# Patient Record
Sex: Male | Born: 1970
Health system: Southern US, Community
[De-identification: ages and names within clinical notes are randomized; demographics above are authoritative.]

## PROBLEM LIST (undated history)

## (undated) DIAGNOSIS — T7840XA Allergy, unspecified, initial encounter: Secondary | ICD-10-CM

## (undated) DIAGNOSIS — K219 Gastro-esophageal reflux disease without esophagitis: Secondary | ICD-10-CM

## (undated) DIAGNOSIS — Z789 Other specified health status: Secondary | ICD-10-CM

## (undated) HISTORY — PX: NASAL SINUS SURGERY: SHX719

## (undated) HISTORY — DX: Allergy, unspecified, initial encounter: T78.40XA

## (undated) HISTORY — DX: Gastro-esophageal reflux disease without esophagitis: K21.9

---

## 2001-11-13 ENCOUNTER — Encounter: Payer: Self-pay | Admitting: Surgery

## 2001-11-13 ENCOUNTER — Encounter: Admission: RE | Admit: 2001-11-13 | Discharge: 2001-11-13 | Payer: Self-pay | Admitting: Surgery

## 2007-02-03 ENCOUNTER — Ambulatory Visit: Payer: Self-pay | Admitting: Emergency Medicine

## 2011-07-09 ENCOUNTER — Ambulatory Visit: Payer: Self-pay | Admitting: Unknown Physician Specialty

## 2012-10-16 ENCOUNTER — Ambulatory Visit (INDEPENDENT_AMBULATORY_CARE_PROVIDER_SITE_OTHER): Payer: 59 | Admitting: Sports Medicine

## 2012-10-16 VITALS — BP 143/89 | Ht 75.0 in | Wt 240.0 lb

## 2012-10-16 DIAGNOSIS — M25572 Pain in left ankle and joints of left foot: Secondary | ICD-10-CM

## 2012-10-16 DIAGNOSIS — M216X9 Other acquired deformities of unspecified foot: Secondary | ICD-10-CM

## 2012-10-16 DIAGNOSIS — M25579 Pain in unspecified ankle and joints of unspecified foot: Secondary | ICD-10-CM | POA: Insufficient documentation

## 2012-10-17 NOTE — Progress Notes (Signed)
  Subjective:    Patient ID: Edward Spears, male    DOB: June 05, 1970, 42 y.o.   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 He's been treated intermittently with chiropractic care over the years and this has been helpful. It was suggested to him that he may want to try some orthotics which is the main reason for today's visit. He's also experiencing some lateral left ankle pain. He suffered a rather significant injury to this ankle while skiing back in February. He twisted the ankle rather violently in his ski boot. Had significant swelling shortly thereafter. His wife works at a urologist office and she was able to get x-rays of his ankle. They are available for my review. Overall, his ankle pain has improved since his injury. Main complaint is stiffness first thing in the morning that improves with walking. Also some discomfort with dorsi flexion. He has not noticed any return swelling. No mechanical symptoms. Denies significant injury to this ankle in the past. No prior ankle surgery.  He is otherwise healthy. Takes no chronic medications. No known drug allergies.     Review of Systems     Objective:   Physical Exam Well-developed, well-nourished. No acute distress. Awake alert and oriented x3.  Left ankle: Full range of motion. No effusion. No soft tissue swelling. Negative anterior drawer but a 1-2+ talar tilt. There is tenderness to palpation over the ATF and over the anterior lateral joint line. No tenderness to palpation over the distal fibula or medial malleolus. No tenderness at the base of the fifth metatarsal nor at the navicular. No tenderness to palpation along the peroneal tendons. No peroneal tendon subluxation with active foot eversion. Neurovascularly intact distally. Good dorsalis pedis and posterior tibial pulses.  Examination of  his feet show fairly well-preserved longitudinal arches but some collapse of the transverse arch is bilaterally. There is no significant callus buildup of the metatarsal heads. No appreciable leg length discrepancy. Patient walks with a supinated gait but no limp.       Assessment & Plan:  1. Supination 2. Status post left ankle injury  Green sports insoles with lateral posts for his supination. Home exercise program for ankle rehabilitation including range of motion and strengthening exercises. Over-the-counter compression sleeve to be worn as needed. Followup with me in 4 weeks. At some point we may need to consider custom orthotics for this gentleman. If his ankle pain persists, we may need to consider further diagnostic imaging.

## 2012-11-29 ENCOUNTER — Ambulatory Visit (INDEPENDENT_AMBULATORY_CARE_PROVIDER_SITE_OTHER): Payer: 59 | Admitting: Sports Medicine

## 2012-11-29 VITALS — BP 135/83 | Ht 75.0 in | Wt 240.0 lb

## 2012-11-29 DIAGNOSIS — M216X9 Other acquired deformities of unspecified foot: Secondary | ICD-10-CM

## 2012-11-29 DIAGNOSIS — M25512 Pain in left shoulder: Secondary | ICD-10-CM

## 2012-11-29 DIAGNOSIS — M25519 Pain in unspecified shoulder: Secondary | ICD-10-CM

## 2012-11-29 NOTE — Progress Notes (Signed)
  Subjective:    Patient ID: Edward Spears, male    DOB: 1971-01-28, 42 y.o.   MRN: [id number]  HPI Patient comes in today for followup. He would like to go ahead with some custom orthotics. He is doing well with his green sports insoles. Ankle is feeling better as well. Is complaining today of 2 weeks of lateral/posterior left shoulder pain. He suffered a dislocation to the shoulder some tender 12 years ago. Workup per his report including x-rays and MRI were unremarkable. He will get occasional discomfort along the lateral shoulder. Awakens him at night from time to time. He has not really taken much in the way of over-the-counter pain medication for this. No recent trauma. He is right-handed.  Interim medical history is unchanged   Review of Systems     Objective:   Physical Exam Well-developed, well-nourished. No acute distress. Awake alert and oriented x3  Left shoulder: Full active range of motion. He does have some limitation in passive external rotation with the left shoulder compared to the right but a negative apprehension. No tenderness along the clavicle or over the a.c. Joint. Rotator cuff strength is 5/5. Negative empty can, negative Hawkins. Negative O'Brien's. Negative clunk. No tenderness over the bicipital groove. No atrophy. Neurovascularly intact distally.  Examination of his feet show a fairly well-preserved longitudinal arch with some collapse of the transverse arch. He walks in slight supination.       Assessment & Plan:  1. Mild left shoulder pain status post remote shoulder dislocation 2. Supination  We will go ahead and make him a pair of custom orthotics today. We will add a small lateral heel wedge to help with supination. For his left shoulder I will show him some general shoulder strengthening exercises. Followup in 4 weeks for a recheck on both his orthotics and his left shoulder. Over-the-counter anti-inflammatories as needed for pain. Call with questions  or concerns prior to follow visit.  Patient was fitted for a : standard, cushioned, semi-rigid orthotic. The orthotic was heated and afterward the patient stood on the orthotic blank positioned on the orthotic stand. The patient was positioned in subtalar neutral position and 10 degrees of ankle dorsiflexion in a weight bearing stance. After completion of molding, a stable base was applied to the orthotic blank. The blank was ground to a stable position for weight bearing. Size:13 Base: Blue EVB Posting: none Additional orthotic padding: lateral heel wedges bilaterally

## 2013-01-01 ENCOUNTER — Ambulatory Visit: Payer: 59 | Admitting: Sports Medicine

## 2013-10-08 ENCOUNTER — Ambulatory Visit: Payer: Self-pay

## 2014-06-12 ENCOUNTER — Ambulatory Visit: Payer: Self-pay | Admitting: Physician Assistant

## 2014-06-12 LAB — RAPID STREP-A WITH REFLX: Micro Text Report: NEGATIVE

## 2014-06-15 LAB — BETA STREP CULTURE(ARMC)

## 2016-03-21 ENCOUNTER — Ambulatory Visit
Admission: EM | Admit: 2016-03-21 | Discharge: 2016-03-21 | Disposition: A | Discharge status: Home / Self Care | Payer: 59 | Attending: Family Medicine | Admitting: Family Medicine

## 2016-03-21 ENCOUNTER — Encounter: Payer: Self-pay | Admitting: Emergency Medicine

## 2016-03-21 DIAGNOSIS — H6501 Acute serous otitis media, right ear: Secondary | ICD-10-CM

## 2016-03-21 MED ORDER — AMOXICILLIN 875 MG PO TABS: 875.0000 mg | ORAL_TABLET | Freq: Two times a day (BID) | ORAL | 0 refills | Status: DC

## 2016-03-21 NOTE — ED Triage Notes (Signed)
Patient c/o pain and pressure in his right ear for over a week.

## 2016-03-21 NOTE — ED Provider Notes (Signed)
[facility]    CSN: [id number] Arrival date & time: 03/21/16  1311     History   Chief Complaint Chief Complaint  Patient presents with  . Ear Fullness  . Otalgia    right    HPI Edward Spears is a 45 y.o. male.   45 yo male with a c/o right ear pain for one week and 2 days of mild left ear pressure as well, associated with mild congestion. Denies any fevers, chills, sore throat, cough.    The history is provided by the patient.    History reviewed. No pertinent past medical history.  Patient Active Problem List   Diagnosis Date Noted  . Pain in joint, ankle and foot 10/16/2012    History reviewed. No pertinent surgical history.     Home Medications    Prior to Admission medications   Medication Sig Start Date End Date Taking? Authorizing Provider  amoxicillin (AMOXIL) 875 MG tablet Take 1 tablet (875 mg total) by mouth 2 (two) times daily. 03/21/16   Norval Gable, MD    Family History History reviewed. No pertinent family history.  Social History Social History  Substance Use Topics  . Smoking status: Never Smoker  . Smokeless tobacco: Never Used  . Alcohol use Yes     Allergies   Patient has no known allergies.   Review of Systems Review of Systems   Physical Exam Triage Vital Signs ED Triage Vitals  Enc Vitals Group     BP 03/21/16 1339 137/81     Pulse Rate 03/21/16 1339 68     Resp 03/21/16 1339 16     Temp 03/21/16 1339 97.5 F (36.4 C)     Temp Source 03/21/16 1339 Tympanic     SpO2 03/21/16 1339 100 %     Weight 03/21/16 1338 240 lb (108.9 kg)     Height 03/21/16 1338 6\' 3"  (1.905 m)     Head Circumference --      Peak Flow --      Pain Score 03/21/16 1338 4     Pain Loc --      Pain Edu? --      Excl. in [place]? --    No data found.   Updated Vital Signs BP 137/81 (BP Location: Left Arm)   Pulse 68   Temp 97.5 F (36.4 C) (Tympanic)   Resp 16   Ht 6\' 3"  (1.905 m)   Wt 240 lb (108.9 kg)   SpO2 100%    BMI 30.00 kg/m   Visual Acuity Right Eye Distance:   Left Eye Distance:   Bilateral Distance:    Right Eye Near:   Left Eye Near:    Bilateral Near:     Physical Exam  Constitutional: He appears well-developed and well-nourished. No distress.  HENT:  Head: Normocephalic and atraumatic.  Right Ear: External ear and ear canal normal. Tympanic membrane is erythematous and bulging. A middle ear effusion is present.  Left Ear: Tympanic membrane, external ear and ear canal normal.  Nose: Nose normal.  Mouth/Throat: Uvula is midline, oropharynx is clear and moist and mucous membranes are normal. No oropharyngeal exudate or tonsillar abscesses.  Eyes: Conjunctivae and EOM are normal. Pupils are equal, round, and reactive to light. Right eye exhibits no discharge. Left eye exhibits no discharge. No scleral icterus.  Neck: Normal range of motion. Neck supple. No tracheal deviation present. No thyromegaly present.  Pulmonary/Chest: Effort normal. No stridor. No respiratory distress.  Lymphadenopathy:    He has no cervical adenopathy.  Neurological: He is alert.  Skin: Skin is warm and dry. No rash noted. He is not diaphoretic.  Nursing note and vitals reviewed.    UC Treatments / Results  Labs (all labs ordered are listed, but only abnormal results are displayed) Labs Reviewed - No data to display  EKG  EKG Interpretation None       Radiology No results found.  Procedures Procedures (including critical care time)  Medications Ordered in UC Medications - No data to display   Initial Impression / Assessment and Plan / UC Course  I have reviewed the triage vital signs and the nursing notes.  Pertinent labs & imaging results that were available during my care of the patient were reviewed by me and considered in my medical decision making (see chart for details).  Clinical Course       Final Clinical Impressions(s) / UC Diagnoses   Final diagnoses:  Right acute  serous otitis media, recurrence not specified    New Prescriptions New Prescriptions   AMOXICILLIN (AMOXIL) 875 MG TABLET    Take 1 tablet (875 mg total) by mouth 2 (two) times daily.   1.diagnosis reviewed with patient 2. rx as per orders above; reviewed possible side effects, interactions, risks and benefits  3. Recommend supportive treatment with otc analgesics 4. Follow-up prn if symptoms worsen or don't improve   Norval Gable, MD 03/21/16 1436

## 2016-03-24 ENCOUNTER — Telehealth: Payer: Self-pay | Admitting: *Deleted

## 2016-05-27 DIAGNOSIS — J209 Acute bronchitis, unspecified: Secondary | ICD-10-CM | POA: Diagnosis not present

## 2016-05-31 ENCOUNTER — Emergency Department (HOSPITAL_COMMUNITY): Payer: 59

## 2016-05-31 ENCOUNTER — Emergency Department (HOSPITAL_COMMUNITY)
Admission: EM | Admit: 2016-05-31 | Discharge: 2016-05-31 | Disposition: A | Discharge status: Home / Self Care | Payer: 59 | Attending: Emergency Medicine | Admitting: Emergency Medicine

## 2016-05-31 ENCOUNTER — Encounter (HOSPITAL_COMMUNITY): Payer: Self-pay | Admitting: Emergency Medicine

## 2016-05-31 DIAGNOSIS — Z79899 Other long term (current) drug therapy: Secondary | ICD-10-CM | POA: Diagnosis not present

## 2016-05-31 DIAGNOSIS — R112 Nausea with vomiting, unspecified: Secondary | ICD-10-CM | POA: Diagnosis not present

## 2016-05-31 DIAGNOSIS — R079 Chest pain, unspecified: Secondary | ICD-10-CM | POA: Diagnosis not present

## 2016-05-31 DIAGNOSIS — K209 Esophagitis, unspecified without bleeding: Secondary | ICD-10-CM

## 2016-05-31 DIAGNOSIS — R05 Cough: Secondary | ICD-10-CM | POA: Diagnosis not present

## 2016-05-31 LAB — CBC
HEMATOCRIT: 50.8 % (ref 39.0–52.0)
Hemoglobin: 18.1 g/dL — ABNORMAL HIGH (ref 13.0–17.0)
MCH: 32.3 pg (ref 26.0–34.0)
MCHC: 35.6 g/dL (ref 30.0–36.0)
MCV: 90.6 fL (ref 78.0–100.0)
Platelets: 121 10*3/uL — ABNORMAL LOW (ref 150–400)
RBC: 5.61 MIL/uL (ref 4.22–5.81)
RDW: 12.5 % (ref 11.5–15.5)
WBC: 11.4 10*3/uL — ABNORMAL HIGH (ref 4.0–10.5)

## 2016-05-31 LAB — BASIC METABOLIC PANEL
Anion gap: 10 (ref 5–15)
BUN: 11 mg/dL (ref 6–20)
CHLORIDE: 98 mmol/L — AB (ref 101–111)
CO2: 26 mmol/L (ref 22–32)
Calcium: 8.9 mg/dL (ref 8.9–10.3)
Creatinine, Ser: 0.85 mg/dL (ref 0.61–1.24)
GFR calc Af Amer: >60 mL/min (ref >60)
GFR calc non Af Amer: >60 mL/min (ref >60)
GLUCOSE: 99 mg/dL (ref 65–99)
Potassium: 3.8 mmol/L (ref 3.5–5.1)
Sodium: 134 mmol/L — ABNORMAL LOW (ref 135–145)

## 2016-05-31 LAB — HEPATIC FUNCTION PANEL
ALK PHOS: 96 U/L (ref 38–126)
ALT: 41 U/L (ref 17–63)
AST: 40 U/L (ref 15–41)
Albumin: 3.5 g/dL (ref 3.5–5.0)
BILIRUBIN DIRECT: 0.1 mg/dL (ref 0.1–0.5)
BILIRUBIN INDIRECT: 0.8 mg/dL (ref 0.3–0.9)
BILIRUBIN TOTAL: 0.9 mg/dL (ref 0.3–1.2)
Total Protein: 6.7 g/dL (ref 6.5–8.1)

## 2016-05-31 LAB — I-STAT TROPONIN, ED: Troponin i, poc: 0 ng/mL (ref 0.00–0.08)

## 2016-05-31 MED ORDER — PANTOPRAZOLE SODIUM 20 MG PO TBEC: 20.0000 mg | DELAYED_RELEASE_TABLET | Freq: Every day | ORAL | 0 refills | Status: DC

## 2016-05-31 MED ORDER — SODIUM CHLORIDE 0.9 % IV BOLUS (SEPSIS)
1000.0000 mL | Freq: Once | INTRAVENOUS | Status: AC
  Administered 2016-05-31: 1000 mL via INTRAVENOUS

## 2016-05-31 MED ORDER — PANTOPRAZOLE SODIUM 40 MG IV SOLR
40.0000 mg | Freq: Once | INTRAVENOUS | Status: AC
  Administered 2016-05-31: 40 mg via INTRAVENOUS
  Filled 2016-05-31: qty 40

## 2016-05-31 NOTE — Discharge Instructions (Signed)
Stop taking the steroids and the antibiotic, follow-up with your  primary care doctor, take antacids as prescribed

## 2016-05-31 NOTE — ED Notes (Signed)
Patient is A & O x4.  He understood discharge instructions 

## 2016-05-31 NOTE — ED Provider Notes (Signed)
[facility] DEPT Provider Note   CSN: [id number] Arrival date & time: 05/31/16  0737     History   Chief Complaint Chief Complaint  Patient presents with  . Chest Pain    HPI Edward Spears is a 46 y.o. male. HPI Pt is complaining of severe heartburn.  It started last Sunday.  He first started with an episode of vomiting after what he though was a bought of food poisoning.  Vomiting settled down on Tuesday.   He had persistent throat irritation.   He went to an ENT and was started on abx and steroid a couple days later.  He then started having diarrhea and urinary urgency.  Over the weekend he developed burning in his throat and in the center of his chest.  It feels tight and it hurts.   It hurts to swallow and his mouth is dry. This morning he had diarrhea again and had dry heaves.     History reviewed. No pertinent past medical history.  Patient Active Problem List   Diagnosis Date Noted  . Pain in joint, ankle and foot 10/16/2012    History reviewed. No pertinent surgical history.     Home Medications    Prior to Admission medications   Medication Sig Start Date End Date Taking? Authorizing Provider  amoxicillin (AMOXIL) 875 MG tablet Take 1 tablet (875 mg total) by mouth 2 (two) times daily. 03/21/16   Norval Gable, MD  pantoprazole (PROTONIX) 20 MG tablet Take 1 tablet (20 mg total) by mouth daily. 05/31/16   Dorie Rank, MD    Family History No family history on file.  Social History Social History  Substance Use Topics  . Smoking status: Never Smoker  . Smokeless tobacco: Never Used  . Alcohol use Yes     Allergies   Patient has no known allergies.   Review of Systems Review of Systems  Constitutional: Negative for fever.  Gastrointestinal: Positive for nausea. Negative for abdominal pain.  All other systems reviewed and are negative.    Physical Exam Updated Vital Signs BP 137/100 (BP Location: Left Arm)   Pulse 77   Temp 98.3 F (36.8 C)  (Oral)   Resp 18   SpO2 100%   Physical Exam  Constitutional: He appears well-developed and well-nourished. No distress.  HENT:  Head: Normocephalic and atraumatic.  Right Ear: External ear normal.  Left Ear: External ear normal.  Eyes: Conjunctivae are normal. Right eye exhibits no discharge. Left eye exhibits no discharge. No scleral icterus.  Neck: Neck supple. No tracheal deviation present.  Cardiovascular: Normal rate, regular rhythm and intact distal pulses.   Pulmonary/Chest: Effort normal and breath sounds normal. No stridor. No respiratory distress. He has no wheezes. He has no rales.  Abdominal: Soft. Bowel sounds are normal. He exhibits no distension. There is no tenderness. There is no rebound and no guarding.  Musculoskeletal: He exhibits no edema or tenderness.  Neurological: He is alert. He has normal strength. No cranial nerve deficit (no facial droop, extraocular movements intact, no slurred speech) or sensory deficit. He exhibits normal muscle tone. He displays no seizure activity. Coordination normal.  Skin: Skin is warm and dry. No rash noted.  Psychiatric: He has a normal mood and affect.  Nursing note and vitals reviewed.    ED Treatments / Results  Labs (all labs ordered are listed, but only abnormal results are displayed) Labs Reviewed  BASIC METABOLIC PANEL - Abnormal; Notable for the following:  Result Value   Sodium 134 (*)    Chloride 98 (*)    All other components within normal limits  CBC - Abnormal; Notable for the following:    WBC 11.4 (*)    Hemoglobin 18.1 (*)    Platelets 121 (*)    All other components within normal limits  HEPATIC FUNCTION PANEL  I-STAT TROPOININ, ED    EKG  EKG Interpretation  Date/Time:  Monday May 31 2016 07:56:43 EST Ventricular Rate:  79 PR Interval:    QRS Duration: 91 QT Interval:  362 QTC Calculation: 415 R Axis:   38 Text Interpretation:  Sinus rhythm Baseline wander in lead(s) V4 V6 No old  tracing to compare Confirmed by Niara Bunker  MD-J, Eugena Rhue [phone]) on 05/31/2016 9:09:33 AM       Radiology Dg Chest 2 View  Result Date: 05/31/2016 CLINICAL DATA:  Chest pain.  Cough. EXAM: CHEST  2 VIEW COMPARISON:  None. FINDINGS: The heart size and mediastinal contours are within normal limits. Slight peribronchial thickening. Slight thoracolumbar scoliosis. IMPRESSION: Slight bronchitic changes. Electronically Signed   By: Lorriane Shire M.D.   On: 05/31/2016 08:40    Procedures Procedures (including critical care time)  Medications Ordered in ED Medications  pantoprazole (PROTONIX) injection 40 mg (40 mg Intravenous Given 05/31/16 1014)  sodium chloride 0.9 % bolus 1,000 mL (1,000 mLs Intravenous New Bag/Given 05/31/16 1014)     Initial Impression / Assessment and Plan / ED Course  I have reviewed the triage vital signs and the nursing notes.  Pertinent labs & imaging results that were available during my care of the patient were reviewed by me and considered in my medical decision making (see chart for details).  Clinical Course   Patient's laboratory tests are reassuring. I doubt that his symptoms are related to a pulmonary or cardiac etiology. I suspect he is having esophagitis associated with his recent illness and possibly a side effect of the medications he has been taking. I will have him discontinue the steroids and the antibiotic.  DC home with antacids.  Final Clinical Impressions(s) / ED Diagnoses   Final diagnoses:  Esophagitis    New Prescriptions New Prescriptions   PANTOPRAZOLE (PROTONIX) 20 MG TABLET    Take 1 tablet (20 mg total) by mouth daily.     Dorie Rank, MD 05/31/16 [phone]

## 2016-05-31 NOTE — ED Triage Notes (Signed)
Pt complaint of continued "heart burn" since Thursday. Pt continues to report recently treated for URI.

## 2016-06-16 DIAGNOSIS — K2100 Gastro-esophageal reflux disease with esophagitis, without bleeding: Secondary | ICD-10-CM | POA: Insufficient documentation

## 2016-06-16 DIAGNOSIS — Z0000 Encounter for general adult medical examination without abnormal findings: Secondary | ICD-10-CM | POA: Diagnosis not present

## 2016-06-23 DIAGNOSIS — J209 Acute bronchitis, unspecified: Secondary | ICD-10-CM | POA: Diagnosis not present

## 2016-06-23 DIAGNOSIS — R05 Cough: Secondary | ICD-10-CM | POA: Diagnosis not present

## 2016-07-06 DIAGNOSIS — K219 Gastro-esophageal reflux disease without esophagitis: Secondary | ICD-10-CM | POA: Diagnosis not present

## 2017-01-12 DIAGNOSIS — L570 Actinic keratosis: Secondary | ICD-10-CM | POA: Diagnosis not present

## 2017-01-12 DIAGNOSIS — D225 Melanocytic nevi of trunk: Secondary | ICD-10-CM | POA: Diagnosis not present

## 2017-06-10 ENCOUNTER — Other Ambulatory Visit: Payer: Self-pay

## 2017-06-10 ENCOUNTER — Encounter: Payer: Self-pay | Admitting: Emergency Medicine

## 2017-06-10 ENCOUNTER — Ambulatory Visit
Admission: EM | Admit: 2017-06-10 | Discharge: 2017-06-10 | Disposition: A | Discharge status: Home / Self Care | Payer: 59 | Attending: Family Medicine | Admitting: Family Medicine

## 2017-06-10 DIAGNOSIS — J029 Acute pharyngitis, unspecified: Secondary | ICD-10-CM | POA: Diagnosis not present

## 2017-06-10 DIAGNOSIS — J069 Acute upper respiratory infection, unspecified: Secondary | ICD-10-CM | POA: Diagnosis not present

## 2017-06-10 DIAGNOSIS — B349 Viral infection, unspecified: Secondary | ICD-10-CM | POA: Diagnosis not present

## 2017-06-10 HISTORY — DX: Other specified health status: Z78.9

## 2017-06-10 LAB — RAPID STREP SCREEN (MED CTR MEBANE ONLY): STREPTOCOCCUS, GROUP A SCREEN (DIRECT): NEGATIVE

## 2017-06-10 MED ORDER — LIDOCAINE VISCOUS 2 % MT SOLN: OROMUCOSAL | 0 refills | Status: DC

## 2017-06-10 NOTE — ED Triage Notes (Signed)
Patient in today c/o 2 day history of nasal congestion (green mucous), fatigue, sore throat. Patient denies fever.

## 2017-06-10 NOTE — ED Provider Notes (Signed)
[facility]    CSN: [id number] Arrival date & time: 06/10/17  1043     History   Chief Complaint Chief Complaint  Patient presents with  . Nasal Congestion    HPI Edward Spears is a 47 y.o. male.   The history is provided by the patient.  URI  Presenting symptoms: congestion, fatigue, rhinorrhea and sore throat   Severity:  Moderate Onset quality:  Sudden Duration:  2 days Timing:  Constant Chronicity:  New Relieved by:  None tried Ineffective treatments:  None tried Associated symptoms: no wheezing   Risk factors: sick contacts   Risk factors: not elderly, no chronic cardiac disease, no chronic kidney disease, no chronic respiratory disease, no diabetes mellitus, no immunosuppression, no recent illness and no recent travel     Past Medical History:  Diagnosis Date  . No known health problems     Patient Active Problem List   Diagnosis Date Noted  . Pain in joint, ankle and foot 10/16/2012    Past Surgical History:  Procedure Laterality Date  . NASAL SINUS SURGERY         Home Medications    Prior to Admission medications   Medication Sig Start Date End Date Taking? Authorizing Provider  ranitidine (ZANTAC) 75 MG tablet Take 75 mg by mouth daily.   Yes [provider]  amoxicillin (AMOXIL) 875 MG tablet Take 1 tablet (875 mg total) by mouth 2 (two) times daily. 03/21/16   Norval Gable, MD  lidocaine (XYLOCAINE) 2 % solution 20 ml gargle and spit q 6 hours prn 06/10/17   Norval Gable, MD  pantoprazole (PROTONIX) 20 MG tablet Take 1 tablet (20 mg total) by mouth daily. 05/31/16   Dorie Rank, MD    Family History Family History  Problem Relation Age of Onset  . Cancer Mother        ovarian  . Thyroid disease Mother   . Cancer Father        lymphoma  . Diabetes Father   . Hypertension Father     Social History Social History   Tobacco Use  . Smoking status: Never Smoker  . Smokeless tobacco: Never Used  Substance Use  Topics  . Alcohol use: Yes    Comment: occassionally  . Drug use: No     Allergies   Patient has no known allergies.   Review of Systems Review of Systems  Constitutional: Positive for fatigue.  HENT: Positive for congestion, rhinorrhea and sore throat.   Respiratory: Negative for wheezing.      Physical Exam Triage Vital Signs ED Triage Vitals [06/10/17 1119]  Enc Vitals Group     BP 133/85     Pulse Rate 95     Resp 16     Temp 98.2 F (36.8 C)     Temp Source Oral     SpO2 98 %     Weight 240 lb (108.9 kg)     Height 6\' 3"  (1.905 m)     Head Circumference      Peak Flow      Pain Score 0     Pain Loc      Pain Edu?      Excl. in [place]?    No data found.  Updated Vital Signs BP 133/85 (BP Location: Left Arm)   Pulse 95   Temp 98.2 F (36.8 C) (Oral)   Resp 16   Ht 6\' 3"  (1.905 m)   Wt 240  lb (108.9 kg)   SpO2 98%   BMI 30.00 kg/m   Visual Acuity Right Eye Distance:   Left Eye Distance:   Bilateral Distance:    Right Eye Near:   Left Eye Near:    Bilateral Near:     Physical Exam  Constitutional: He appears well-developed and well-nourished. No distress.  HENT:  Head: Normocephalic and atraumatic.  Right Ear: Tympanic membrane, external ear and ear canal normal.  Left Ear: Tympanic membrane, external ear and ear canal normal.  Nose: Nose normal.  Mouth/Throat: Uvula is midline, oropharynx is clear and moist and mucous membranes are normal. No oropharyngeal exudate or tonsillar abscesses.  Eyes: Conjunctivae and EOM are normal. Pupils are equal, round, and reactive to light. Right eye exhibits no discharge. Left eye exhibits no discharge. No scleral icterus.  Neck: Normal range of motion. Neck supple. No tracheal deviation present. No thyromegaly present.  Cardiovascular: Normal rate, regular rhythm and normal heart sounds.  Pulmonary/Chest: Effort normal and breath sounds normal. No stridor. No respiratory distress. He has no wheezes. He has no  rales. He exhibits no tenderness.  Lymphadenopathy:    He has no cervical adenopathy.  Neurological: He is alert.  Skin: Skin is warm and dry. No rash noted. He is not diaphoretic.  Nursing note and vitals reviewed.    UC Treatments / Results  Labs (all labs ordered are listed, but only abnormal results are displayed) Labs Reviewed  RAPID STREP SCREEN (NOT AT [facility])  CULTURE, GROUP A STREP [facility])    EKG  EKG Interpretation None       Radiology No results found.  Procedures Procedures (including critical care time)  Medications Ordered in UC Medications - No data to display   Initial Impression / Assessment and Plan / UC Course  I have reviewed the triage vital signs and the nursing notes.  Pertinent labs & imaging results that were available during my care of the patient were reviewed by me and considered in my medical decision making (see chart for details).       Final Clinical Impressions(s) / UC Diagnoses   Final diagnoses:  Viral URI    ED Discharge Orders        Ordered    lidocaine (XYLOCAINE) 2 % solution     06/10/17 1204     1. Lab results and diagnosis reviewed with patient 2. rx as per orders above; reviewed possible side effects, interactions, risks and benefits  3. Recommend supportive treatment with rest, fluids, otc meds 4. Follow-up prn if symptoms worsen or don't improve   Controlled Substance Prescriptions [place] Controlled Substance Registry consulted? Not Applicable   Norval Gable, MD 06/10/17 [phone]

## 2017-06-13 LAB — CULTURE, GROUP A STREP (THRC)

## 2017-06-14 DIAGNOSIS — J0140 Acute pansinusitis, unspecified: Secondary | ICD-10-CM | POA: Diagnosis not present

## 2018-03-21 ENCOUNTER — Ambulatory Visit: Payer: 59 | Admitting: Podiatry

## 2018-03-21 ENCOUNTER — Telehealth: Payer: Self-pay | Admitting: Podiatry

## 2018-03-21 ENCOUNTER — Ambulatory Visit (INDEPENDENT_AMBULATORY_CARE_PROVIDER_SITE_OTHER): Payer: 59

## 2018-03-21 ENCOUNTER — Encounter: Payer: Self-pay | Admitting: Podiatry

## 2018-03-21 VITALS — BP 128/82 | HR 73 | Resp 16

## 2018-03-21 DIAGNOSIS — M778 Other enthesopathies, not elsewhere classified: Secondary | ICD-10-CM

## 2018-03-21 DIAGNOSIS — M779 Enthesopathy, unspecified: Secondary | ICD-10-CM | POA: Diagnosis not present

## 2018-03-21 DIAGNOSIS — M7752 Other enthesopathy of left foot: Secondary | ICD-10-CM | POA: Diagnosis not present

## 2018-03-21 MED ORDER — METHYLPREDNISOLONE 4 MG PO TBPK: ORAL_TABLET | ORAL | 0 refills | Status: DC

## 2018-03-21 MED ORDER — MELOXICAM 15 MG PO TABS: 15.0000 mg | ORAL_TABLET | Freq: Every day | ORAL | 3 refills | Status: DC

## 2018-03-21 NOTE — Telephone Encounter (Signed)
I was just in this afternoon and got a cortisone injection. Since then, I've started itching and I've got some wept's that have popped up on my arm. Could this be an allergic reaction? Please call me back at [phone].

## 2018-03-21 NOTE — Telephone Encounter (Signed)
I spoke to Gretta Arab, RN and she states pt returned to the office and was treated for an allergic reaction.

## 2018-03-22 NOTE — Progress Notes (Signed)
  Subjective:  Patient ID: Edward Spears, male    DOB: Mar 07, 1971,  MRN: [id number] HPI Chief Complaint  Patient presents with  . Foot Pain    Plantar forefoot left - aching x 2 months, tried Ibuprofen and ice  . New Patient (Initial Visit)    47 y.o. male presents with the above complaint.   ROS: Denies fever chills nausea vomiting muscle aches pains calf pain back pain chest pain shortness of breath.  Past Medical History:  Diagnosis Date  . No known health problems    Past Surgical History:  Procedure Laterality Date  . NASAL SINUS SURGERY      Current Outpatient Medications:  .  meloxicam (MOBIC) 15 MG tablet, Take 1 tablet (15 mg total) by mouth daily., Disp: 30 tablet, Rfl: 3 .  methylPREDNISolone (MEDROL DOSEPAK) 4 MG TBPK tablet, 6 day dose pack - take as directed, Disp: 21 tablet, Rfl: 0  No Known Allergies Review of Systems Objective:   Vitals:   03/21/18 1400  BP: 128/82  Pulse: 73  Resp: 16    General: Well developed, nourished, in no acute distress, alert and oriented x3   Dermatological: Skin is warm, dry and supple bilateral. Nails x 10 are well maintained; remaining integument appears unremarkable at this time. There are no open sores, no preulcerative lesions, no rash or signs of infection present.  Vascular: Dorsalis Pedis artery and Posterior Tibial artery pedal pulses are 2/4 bilateral with immedate capillary fill time. Pedal hair growth present. No varicosities and no lower extremity edema present bilateral.   Neruologic: Grossly intact via light touch bilateral. Vibratory intact via tuning fork bilateral. Protective threshold with Semmes Wienstein monofilament intact to all pedal sites bilateral. Patellar and Achilles deep tendon reflexes 2+ bilateral. No Babinski or clonus noted bilateral.   Musculoskeletal: No gross boney pedal deformities bilateral. No pain, crepitus, or limitation noted with foot and ankle range of motion bilateral. Muscular  strength 5/5 in all groups tested bilateral.  Gait: Unassisted, Nonantalgic.    Radiographs:  Radiographs taken today do not demonstrate any type of osseous abnormalities other than some diastases between the third and fourth metatarsals of the left foot.  Assessment & Plan:   Assessment: Capsulitis cannot rule out some neuroma involvement second third intermetatarsal spaces.  Plan: After sterile Betadine skin prep I injected 2 mg of dexamethasone and local anesthetic to the point of maximal tenderness in the second and third metatarsal phalangeal joint.  Tolerated procedure well without complications.  Also prescribed a Medrol Dosepak to be followed by meloxicam.  Discussed appropriate shoe gear stretching exercise ice therapy sugar modifications and the fact that we may need to get him casted for orthotics.      [place]. [place], [place]

## 2018-04-24 ENCOUNTER — Ambulatory Visit: Payer: 59 | Admitting: Podiatry

## 2018-04-24 ENCOUNTER — Encounter: Payer: Self-pay | Admitting: Podiatry

## 2018-04-24 DIAGNOSIS — M778 Other enthesopathies, not elsewhere classified: Secondary | ICD-10-CM

## 2018-04-24 DIAGNOSIS — M779 Enthesopathy, unspecified: Secondary | ICD-10-CM

## 2018-04-24 NOTE — Progress Notes (Signed)
He presents today for follow-up of his capsulitis left foot.  States that is doing much better he says about 80% better still bothers me to some degree.  States that he has orthotics that he is had for quite some time he states that the injection in the meloxicam helped considerably.  Objective: Vital signs are stable he is alert and oriented x3.  Pulses are palpable.  He has mild tenderness on range of motion of the third toe on the and mild tenderness on palpation of the metatarsal phalangeal joint third left.  Assessment: Capsulitis resolving second metatarsophalangeal joint nearly resolved left third metatarsal phalangeal joint.  Plan: Sterile Betadine skin prep I injected 1 cc of a 50-50 mixture of 4% Decadron and local anesthetic.  This was injected to the third interdigital space just beside the lateral aspect of the third metatarsal phalangeal joint.  Tolerated procedure well without complications.

## 2018-11-08 ENCOUNTER — Other Ambulatory Visit: Payer: Self-pay

## 2018-11-08 ENCOUNTER — Ambulatory Visit: Payer: 59 | Admitting: Podiatry

## 2018-11-08 ENCOUNTER — Encounter: Payer: Self-pay | Admitting: Podiatry

## 2018-11-08 VITALS — Temp 97.8°F

## 2018-11-08 DIAGNOSIS — M779 Enthesopathy, unspecified: Secondary | ICD-10-CM | POA: Diagnosis not present

## 2018-11-08 DIAGNOSIS — M778 Other enthesopathies, not elsewhere classified: Secondary | ICD-10-CM

## 2018-11-08 NOTE — Progress Notes (Signed)
He presents today chief complaint of a painful flareup to the third interdigital space and third metatarsal phalangeal joint of the left foot.  He states his been doing this for about 4 weeks now he states that  third toe and he requests a possible injection or other treatment.  Objective: Vital signs are stable he is alert and oriented x3.  Pulses are palpable.  At this point he has pain on palpation of the third digit of the left foot and pain on end range of motion.  There is no pain on palpation to the intermetatarsal space.  Assessment: Capsulitis third metatarsophalangeal joint left foot.  Plan: Injected with half cc of Celestone Soluspan and local anesthetic.  Tolerated procedure well.  I want to have him scanned today for orthotics and I will follow-up with him once that is complete.

## 2018-11-29 ENCOUNTER — Other Ambulatory Visit: Payer: 59 | Admitting: Orthotics

## 2018-11-29 ENCOUNTER — Other Ambulatory Visit: Payer: Self-pay

## 2018-12-23 ENCOUNTER — Ambulatory Visit
Admission: EM | Admit: 2018-12-23 | Discharge: 2018-12-23 | Disposition: A | Discharge status: Home / Self Care | Payer: 59 | Attending: Urgent Care | Admitting: Urgent Care

## 2018-12-23 ENCOUNTER — Other Ambulatory Visit: Payer: Self-pay

## 2018-12-23 ENCOUNTER — Encounter: Payer: Self-pay | Admitting: Emergency Medicine

## 2018-12-23 DIAGNOSIS — H1032 Unspecified acute conjunctivitis, left eye: Secondary | ICD-10-CM | POA: Diagnosis not present

## 2018-12-23 MED ORDER — POLYMYXIN B-TRIMETHOPRIM 10000-0.1 UNIT/ML-% OP SOLN: OPHTHALMIC | 0 refills | Status: DC

## 2018-12-23 NOTE — ED Provider Notes (Signed)
[facility], [place]   Name: Edward Spears DOB: 1970-07-31 MRN: [id number] CSN: [id number] PCP: System, Pcp Not In  Arrival date and time:  12/23/18 1003  Chief Complaint:  Eye Problem (left)   NOTE: Prior to seeing the patient today, I have reviewed the triage nursing documentation and vital signs. Clinical staff has updated patient's PMH/PSHx, current medication list, and drug allergies/intolerances to ensure comprehensive history available to assist in medical decision making.   History:   HPI: Edward Spears is a 48 y.o. male who presents today with complaints of pain and redness in his LEFT eye that began "about a week" ago. He denies any injury to the eye. Patient with excessive tearing and significant scleral erythema. He has experienced yellow discharge and exudative crusting. Vision is blurred. Patient notes that eye irritation is not exacerbated by light (photophobia). He does wear contact lenses; currently in place. Patient has tried any over the counter eye dropsto help reduce/relieve his current symptoms. Patient endorses the fact that he has been rubbing his eyes more since the onset of his symptoms.   Visual Acuity: Right Eye Distance: 20/25 corrected Left Eye Distance: 20/25 corrected Bilateral Distance: 20/20 corrected   Past Medical History:  Diagnosis Date  . No known health problems     Past Surgical History:  Procedure Laterality Date  . NASAL SINUS SURGERY      Family History  Problem Relation Age of Onset  . Cancer Mother        ovarian  . Thyroid disease Mother   . Cancer Father        lymphoma  . Diabetes Father   . Hypertension Father     Social History   Tobacco Use  . Smoking status: Never Smoker  . Smokeless tobacco: Never Used  Substance Use Topics  . Alcohol use: Yes    Comment: occassionally  . Drug use: No    Patient Active Problem List   Diagnosis Date Noted  . Esophagitis, reflux 06/16/2016  . Pain in joint, ankle and foot  10/16/2012    Home Medications:    Current Meds  Medication Sig  . azelastine (ASTELIN) 0.1 % nasal spray INHALE 2 PUFFS IN EACH NOSTRIL TWO TIMES DAILY  . cetirizine (ZYRTEC) 10 MG tablet Take 10 mg by mouth daily.  . fluticasone (FLONASE) 50 MCG/ACT nasal spray SPRAY TWICE IN EACH NOSTRIL ONCE DAILY    Allergies:   Kenalog [triamcinolone acetonide]  Review of Systems (ROS): Review of Systems  Constitutional: Negative for chills and fever.  HENT: Negative for congestion, ear pain and sinus pain.   Eyes: Positive for pain, discharge, redness, itching and visual disturbance.  Respiratory: Negative for cough and shortness of breath.   Cardiovascular: Negative for chest pain and palpitations.  Neurological: Negative for dizziness, syncope, weakness and headaches.  Hematological: Negative for adenopathy.  All other systems reviewed and are negative.    Vital Signs: Today's Vitals   12/23/18 1026 12/23/18 1029 12/23/18 1047  BP:  128/90   Pulse:  68   Resp:  16   Temp:  98.4 F (36.9 C)   TempSrc:  Oral   SpO2:  98%   Weight: 250 lb (113.4 kg)    Height: 6\' 3"  (1.905 m)    PainSc: 3   3     Physical Exam: Physical Exam  Constitutional: He is oriented to person, place, and time and well-developed, well-nourished, and in no distress.  HENT:  Head: Normocephalic and  atraumatic.  Mouth/Throat: Mucous membranes are normal.  Eyes: Pupils are equal, round, and reactive to light. EOM are normal. Right eye exhibits no discharge. Left eye exhibits discharge (yellow) and exudate (yellow). Right conjunctiva is not injected. Right conjunctiva has no hemorrhage. Left conjunctiva is injected.  Contact lens in place. (+) marked scleral erythema.   Neck: Normal range of motion. Neck supple. No tracheal deviation present.  Cardiovascular: Normal rate.  Pulmonary/Chest: Effort normal. No respiratory distress.  Lymphadenopathy:    He has no cervical adenopathy.  Neurological: He is  alert and oriented to person, place, and time. Gait normal. GCS score is 15.  Skin: Skin is warm and dry. No rash noted.  Psychiatric: Mood, memory, affect and judgment normal.  Nursing note and vitals reviewed.   Urgent Care Treatments / Results:   LABS: PLEASE NOTE: all labs that were ordered this encounter are listed, however only abnormal results are displayed. Labs Reviewed - No data to display  EKG: -None  RADIOLOGY: No results found.  PROCEDURES: Procedures  MEDICATIONS RECEIVED THIS VISIT: Medications - No data to display  PERTINENT CLINICAL COURSE NOTES/UPDATES:   Initial Impression / Assessment and Plan / Urgent Care Course:  Pertinent labs & imaging results that were available during my care of the patient were personally reviewed by me and considered in my medical decision making (see lab/imaging section of note for values and interpretations).  Edward Spears is a 48 y.o. male who presents to [facility] today with complaints of Eye Problem (left)   Patient is well appearing overall in clinic today. He does not appear to be in any acute distress. Presenting symptoms (see HPI) and exam as documented above. Symptoms and physical exam consistent with acute bacterial conjunctivitis. Will cover with Polytrim ophthalmic gtts q4h while awake for the next 5 days. Patient advised to remove and discard contact. He was advised to avoid wearing contacts until infection clears. Discussed cool compresses to help soothe eyes. Patient encouraged to avoid rubbing his eye to prevent transmission. May use Tylenol and/or Ibuprofen as needed for discomfort.   Discussed follow up with primary eye doctor in 1 week for re-evaluation if not improving. Discussed that any worsening of his vision will necessitate him being seen sooner. I have reviewed the follow up and strict return precautions for any new or worsening symptoms. Patient is aware of symptoms that would be deemed  urgent/emergent, and would thus require further evaluation either here or in the emergency department. At the time of discharge, he verbalized understanding and consent with the discharge plan as it was reviewed with him. All questions were fielded by provider and/or clinic staff prior to patient discharge.    Final Clinical Impressions / Urgent Care Diagnoses:   Final diagnoses:  Acute bacterial conjunctivitis of left eye    New Prescriptions:  [place] Controlled Substance Registry consulted? Not Applicable  Meds ordered this encounter  Medications  . trimethoprim-polymyxin b (POLYTRIM) ophthalmic solution    Sig: 2 gtts to LEFT eye q4h while awake x 5 days.    Dispense:  10 mL    Refill:  0    Recommended Follow up Care:  Patient encouraged to follow up with the following provider within the specified time frame, or sooner as dictated by the severity of his symptoms. As always, he was instructed that for any urgent/emergent care needs, he should seek care either here or in the emergency department for more immediate evaluation.  Follow-up Information  You eye doctor In 1 week.   Why: General reassessment of symptoms if not improving        NOTE: This note was prepared using Lobbyist along with smaller Company secretary. Despite my best ability to proofread, there is the potential that transcriptional errors may still occur from this process, and are completely unintentional.      Karen Kitchens, NP 12/23/18 1956

## 2018-12-23 NOTE — ED Triage Notes (Signed)
Patient c/o left eye redness, drainage and tenderness that started a week ago.  Patient denies any other cold symptoms and denies fevers.

## 2018-12-23 NOTE — Discharge Instructions (Signed)
It was very nice seeing you today in clinic. Thank you for entrusting me with your care.   Please utilize the medications that we discussed. Your prescriptions have been called in to your pharmacy. Avoid rubbing eyes. DO NOT wear contact. Cool compresses may help soothe your eye.  Make arrangements to follow up with your regular eye doctor in 1 week for re-evaluation if not improving. If your symptoms/condition worsens, please seek follow up care either here or in the ER. Please remember, our [place] providers are "right here with you" when you need [place].   Again, it was my pleasure to take care of you today. Thank you for choosing our clinic. I hope that you start to feel better quickly.   Honor Loh, MSN, APRN, FNP-C, CEN Advanced Practice Provider [facility]

## 2018-12-28 ENCOUNTER — Telehealth: Payer: Self-pay | Admitting: Emergency Medicine

## 2018-12-28 DIAGNOSIS — H1032 Unspecified acute conjunctivitis, left eye: Secondary | ICD-10-CM

## 2018-12-28 MED ORDER — ERYTHROMYCIN 5 MG/GM OP OINT: TOPICAL_OINTMENT | OPHTHALMIC | 0 refills | Status: DC

## 2018-12-28 NOTE — Telephone Encounter (Signed)
Pt called back stating that his conjunctivitis was not resolved and states that he was told to call back if the solution did not work and would call in the ointment. Pt uses [facility]. Per verbal order sent in erythromycin ointment 1g QID x 5 days no refills.

## 2019-01-02 ENCOUNTER — Ambulatory Visit: Payer: 59 | Admitting: Sports Medicine

## 2019-01-02 ENCOUNTER — Other Ambulatory Visit: Payer: Self-pay

## 2019-01-02 ENCOUNTER — Ambulatory Visit
Admission: RE | Admit: 2019-01-02 | Discharge: 2019-01-02 | Disposition: A | Discharge status: Home / Self Care | Payer: 59 | Source: Ambulatory Visit | Attending: Sports Medicine | Admitting: Sports Medicine

## 2019-01-02 ENCOUNTER — Encounter: Payer: Self-pay | Admitting: Sports Medicine

## 2019-01-02 VITALS — BP 128/98 | Ht 75.0 in | Wt 250.0 lb

## 2019-01-02 DIAGNOSIS — S29011A Strain of muscle and tendon of front wall of thorax, initial encounter: Secondary | ICD-10-CM | POA: Diagnosis not present

## 2019-01-02 DIAGNOSIS — M25562 Pain in left knee: Secondary | ICD-10-CM | POA: Diagnosis not present

## 2019-01-02 NOTE — Progress Notes (Signed)
[facility] [address] 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  6 weeks ago he was squatting and noticed a sharp pain in his anterior medial knee.  Denies a pop or swelling.  Alleviating factors include rest and ice.  He has avoided squatting and just been doing leg extensions at home.  Has not prohibited him from his ADLs or job.  He denies numbness, tingling, radiating pain, or skin changes.  Endorses it feels like his knee is weak.  Patient is wearing a brace that has helped.  Patient is also complaining of right chest pain.  1 year ago he was doing a wide grip bench press and on the eccentric load felt a what he describes tear in his chest.  Denies any erythema or skin changes, but endorsed noticeable swelling.  Saw a chiropractor and had modality treatment with pain improving over time.  Unfortunately he has not been able to benchpress the weight he was before the injury, but is still able to perform flies and a low weight bench press.  Denies shoulder pain, numbness, tingling.  Pain is worse and terminal horizontal abduction.     ROS:     See HPI.   PERTINENT  PMH / PSH / FH / SH:  Past Medical, Surgical, Social, and Family History Reviewed & Updated in the EMR. Pertinent findings include:  Kenalog allergy   OBJECTIVE:  BP (!) 128/98   Ht 6\' 3"  (1.905 m)   Wt 250 lb (113.4 kg)   BMI 31.25 kg/m  Physical Exam:  Vital signs are reviewed.   GEN: Alert and oriented, NAD Pulm: Breathing unlabored PSY: normal mood, congruent  affect  MSK: Left knee: Normal to inspection with no erythema or effusion or obvious bony abnormalities. Palpation normal with no warmth, joint line tenderness, patellar tenderness, or condyle tenderness. ROM full in flexion and extension and lower leg rotation. Ligaments with solid consistent endpoints including ACL, PCL, LCL, MCL. Negative Mcmurray's and Thessaly tests. + patellar compression. Patellar glide with crepitus. Patellar and quadriceps tendons unremarkable. Hamstring and quadriceps strength is normal.  Neurovascularly intact.  Right Pec Normal to inspection with no erythema or effusion. Nipple line equal heights bilaterally ROM full in flexion, extension, forearm flexion and extension No pain with resisted ROM No TTP at pec tendon   ASSESSMENT & PLAN:   1. Left knee pain Likely secondary to patellofemoral dysfunction.  Will order x-ray to look for any changes in joint space or sclerotic changes.  Given home quad strengthening exercises.  Prescribed Helix sleeve today to wear at work.  Will follow-up in 5 weeks, if no improvement consider ultrasound v. MRI  2. Right pec pain Given history, patient likely has partial pec tear in muscle belly that has healed with scar tissue.  Discussed being studious wall in the weight room with the chest to avoid further injury or rupture of tendon.   Edward Clam, DO, ATC Sports Medicine Fellow  Patient seen and evaluated with the sports medicine fellow.  I agree with the above plan of care.  Patient's left knee x-rays are unremarkable.  Home exercises as above.  Body helix compression sleeve at work.  Follow-up in 4 to 5 weeks.  If symptoms persist, consider further diagnostic imaging. In regards to his right pectoralis major muscle injury, injury was about a year ago.  Historically, it sounds like he suffered a partial tear of either the muscle belly or at the musculotendinous junction.  There is no gross deformity on today's  exam.  His strength is good.  We discussed being very careful working out with heavy weights in the gym, especially when working chest.  Patient understands.

## 2019-02-13 ENCOUNTER — Other Ambulatory Visit: Payer: Self-pay

## 2019-02-13 ENCOUNTER — Encounter: Payer: Self-pay | Admitting: Sports Medicine

## 2019-02-13 ENCOUNTER — Ambulatory Visit (INDEPENDENT_AMBULATORY_CARE_PROVIDER_SITE_OTHER): Payer: 59 | Admitting: Sports Medicine

## 2019-02-13 VITALS — BP 126/86 | Ht 75.0 in | Wt 240.0 lb

## 2019-02-13 DIAGNOSIS — M25562 Pain in left knee: Secondary | ICD-10-CM | POA: Diagnosis not present

## 2019-02-13 NOTE — Progress Notes (Signed)
   [facility] [address] [place], [place] [address] Phone: [phone] Fax: [phone]   Patient Name: Edward Spears Date of Birth: 1970/11/05 Medical Record Number: [id number] Gender: male Date of Encounter: 02/13/2019  SUBJECTIVE:      Chief Complaint:  Left knee pain   HPI:  Edward Spears is following up for left knee pain.  Overall he has noticed improvement with the HEP and wearing the knee brace.  His main complaint today is more weakness with deep squats.  Slowly try to increase lower extremity weight lifting in the gym.  He denies swelling, locking, catching, skin changes, erythema, or numbness.  Is not taking medication regularly.  It is alleviated with rest and ice.     ROS:     See HPI.   PERTINENT  PMH / PSH / FH / SH:  Past Medical, Surgical, Social, and Family History Reviewed & Updated in the EMR.    OBJECTIVE:  BP 126/86   Ht 6\' 3"  (1.905 m)   Wt 240 lb (108.9 kg)   BMI 30.00 kg/m  Physical Exam:  Vital signs are reviewed.   GEN: Alert and oriented, NAD Pulm: Breathing unlabored PSY: normal mood, congruent affect  MSK: Left knee: Normal to inspection with no erythema or effusion or obvious bony abnormalities. Palpation normal with no warmth, joint line tenderness, patellar tenderness, or condyle tenderness. ROM full in flexion and extension and lower leg rotation. Ligaments with solid consistent endpoints including ACL, PCL, LCL, MCL. Negative Mcmurray's and Thessaly tests. Negative patellar compression. Patellar glide with crepitus. Patellar and quadriceps tendons unremarkable. Hamstring and quadriceps strength is normal.  Neurovascularly intact  ASSESSMENT & PLAN:   1. Left knee pain Given the improvement in pain and main complaint being weakness in the setting of normal radiographic findings, we have ordered physical therapy today.  He is to continue his HEP and wear his brace.  We will follow-up in 2 months if  symptoms are not progressing the way he is happy with, at which time we can consider an MRI.   Lanier Clam, DO, ATC Sports Medicine Fellow  Patient seen and evaluated with the sports medicine fellow.  I agree with the above plan of care.  Patient will start physical therapy with Edward Spears in [place].  Follow-up with me in 8 weeks.  If he continues to have symptoms despite increasing his strength that I would consider merits of further diagnostic imaging.

## 2020-02-02 ENCOUNTER — Encounter: Payer: Self-pay | Admitting: Emergency Medicine

## 2020-02-02 ENCOUNTER — Ambulatory Visit
Admission: EM | Admit: 2020-02-02 | Discharge: 2020-02-02 | Disposition: A | Discharge status: Home / Self Care | Payer: 59 | Attending: Emergency Medicine | Admitting: Emergency Medicine

## 2020-02-02 ENCOUNTER — Other Ambulatory Visit: Payer: Self-pay

## 2020-02-02 DIAGNOSIS — L0591 Pilonidal cyst without abscess: Secondary | ICD-10-CM | POA: Diagnosis not present

## 2020-02-02 DIAGNOSIS — W57XXXA Bitten or stung by nonvenomous insect and other nonvenomous arthropods, initial encounter: Secondary | ICD-10-CM

## 2020-02-02 MED ORDER — PERMETHRIN 5 % EX CREA: TOPICAL_CREAM | CUTANEOUS | 0 refills | Status: AC

## 2020-02-02 NOTE — ED Triage Notes (Signed)
Patient in today c/o rash and itching on his back x 1 week and rash and itching on his legs yesterday. Patient has took Benadryl at ~4am this morning.

## 2020-02-02 NOTE — ED Provider Notes (Signed)
[facility] - [place], [place]   Name: Edward Spears DOB: December 31, 1970 MRN: [id number] CSN: [id number] PCP: Pcp, No  Arrival date and time:  02/02/20 7829  Chief Complaint:  Rash and Pruritis   NOTE: Prior to seeing the patient today, I have reviewed the triage nursing documentation and vital signs. Clinical staff has updated patient's PMH/PSHx, current medication list, and drug allergies/intolerances to ensure comprehensive history available to assist in medical decision making.   History:   HPI: Edward Spears is a 49 y.o. male who presents today with complaints of a rash.  Patient states the rash started approximately 3 days ago.  He noticed increased itching to his trunk and abdomen, and red area started to appear shortly afterwards.  The rash has since increased to his lower extremities. He denies any fevers, body aches, or other systemic symptoms.  He is use Benadryl up alleviate his symptoms noted some mild short-term symptom improvement.  Pertinent social history includes: Recent stay at a vacation home (9/12-9/16), Curator, occasional landscaper.   Patient also noticed a "knot" to his left lower back.  He states he first noticed area approximately 1 week ago.  The area is painless to touch and does not bother him throughout the day, however some mild discomfort is noticed when sitting for long period of time.  He denies any radiating pain throughout his lower back or his left lower extremity.  Past Medical History:  Diagnosis Date   Allergies    GERD (gastroesophageal reflux disease)     Past Surgical History:  Procedure Laterality Date   NASAL SINUS SURGERY      Family History  Problem Relation Age of Onset   Cancer Mother        ovarian   Thyroid disease Mother    Cancer Father        lymphoma   Diabetes Father    Hypertension Father     Social History   Tobacco Use   Smoking status: Never Smoker   Smokeless tobacco: Never  Used  Vaping Use   Vaping Use: Never used  Substance Use Topics   Alcohol use: Yes    Comment: occassionally   Drug use: No    Patient Active Problem List   Diagnosis Date Noted   Esophagitis, reflux 06/16/2016   Pain in joint, ankle and foot 10/16/2012    Home Medications:    Current Meds  Medication Sig   Ascorbic Acid (VITAMIN C PO) Take 1 tablet by mouth daily.   cetirizine (ZYRTEC) 10 MG tablet Take 10 mg by mouth daily.   fluticasone (FLONASE) 50 MCG/ACT nasal spray SPRAY TWICE IN EACH NOSTRIL ONCE DAILY   Multiple Vitamins-Minerals (ZINC PO) Take 1 tablet by mouth daily.   omeprazole (PRILOSEC) 20 MG capsule Take 20 mg by mouth daily.   VITAMIN D PO Take 1 tablet by mouth daily.    Allergies:   Kenalog [triamcinolone acetonide]  Review of Systems (ROS): Review of Systems  Constitutional: Negative for chills and fever.  Respiratory: Negative for cough and shortness of breath.   Cardiovascular: Negative for chest pain.  Gastrointestinal: Negative for abdominal distention.  Skin: Positive for color change and rash.  Neurological: Negative for syncope.  All other systems reviewed and are negative.    Vital Signs: Today's Vitals   02/02/20 0935 02/02/20 0936  BP:  (!) 144/86  Pulse:  73  Resp:  18  Temp:  98.2 F (36.8 C)  TempSrc:  Oral  SpO2:  97%  Weight: 240 lb (108.9 kg)   Height: 6\' 3"  (1.905 m)   PainSc: 0-No pain     Physical Exam: Physical Exam Vitals and nursing note reviewed.  Constitutional:      Appearance: Normal appearance.  Cardiovascular:     Rate and Rhythm: Normal rate and regular rhythm.     Pulses: Normal pulses.     Heart sounds: Normal heart sounds.  Pulmonary:     Effort: Pulmonary effort is normal.     Breath sounds: Normal breath sounds.  Skin:    General: Skin is warm and dry.     Findings: Rash present. Rash is papular.          Comments: Blanchable, erythematous papular rash to chest, abdomen, and right  inner thigh.  Rash consistent with urticaria due to bedbugs or mites.  Neurological:     General: No focal deficit present.     Mental Status: He is alert and oriented to person, place, and time.  Psychiatric:        Mood and Affect: Mood normal.        Behavior: Behavior normal.      Urgent Care Treatments / Results:   LABS: PLEASE NOTE: all labs that were ordered this encounter are listed, however only abnormal results are displayed. Labs Reviewed - No data to display  EKG: -None  RADIOLOGY: No results found.  PROCEDURES: Procedures  MEDICATIONS RECEIVED THIS VISIT: Medications - No data to display  PERTINENT CLINICAL COURSE NOTES/UPDATES:   Initial Impression / Assessment and Plan / Urgent Care Course:  Pertinent labs & imaging results that were available during my care of the patient were personally reviewed by me and considered in my medical decision making (see lab/imaging section of note for values and interpretations).  Edward Spears is a 48 y.o. male who presents to [facility] today with complaints of a rash, diagnosed with bedbug bites, and treated as such with the medications below. NP and patient reviewed discharge instructions below during visit.   Patient is well appearing overall in clinic today. He does not appear to be in any acute distress. Presenting symptoms (see HPI) and exam as documented above.   I have reviewed the follow up and strict return precautions for any new or worsening symptoms. Patient is aware of symptoms that would be deemed urgent/emergent, and would thus require further evaluation either here or in the emergency department. At the time of discharge, he verbalized understanding and consent with the discharge plan as it was reviewed with him. All questions were fielded by provider and/or clinic staff prior to patient discharge.    Final Clinical Impressions / Urgent Care Diagnoses:   Final diagnoses:  Bedbug bite, initial  encounter  Cyst near tailbone    New Prescriptions:  [place] Controlled Substance Registry consulted? Not Applicable  Meds ordered this encounter  Medications   permethrin (ELIMITE) 5 % cream    Sig: Apply to affected area once.  Keep on for 8-12 hours.  Rinse off afterwards.    Dispense:  60 g    Refill:  0      Discharge Instructions     You were seen for a rash and are being treated for bedbugs/mites.   -Use over-the-counter antihistamines or cetirizine (Zyrtec) and famotidine (Pepcid) daily to help with your itching. -Benadryl can be added at nighttime -Use over-the-counter topical cream such as hydrocortisone cream to also help with  itching. -Use prescription lotion as directed to treat for other possible mite infections. -Wash sheets in hot water. -Follow-up with your primary care provider regarding the knot on your back.  Take care, Dr. Marland Kitchen, NP-c     Recommended Follow up Care:  Patient encouraged to follow up with the following provider within the specified time frame, or sooner as dictated by the severity of his symptoms. As always, he was instructed that for any urgent/emergent care needs, he should seek care either here or in the emergency department for more immediate evaluation.   Gertie Baron, DNP, NP-c    Gertie Baron, NP 02/02/20 [phone]

## 2020-02-02 NOTE — Discharge Instructions (Addendum)
You were seen for a rash and are being treated for bedbugs/mites.   -Use over-the-counter antihistamines or cetirizine (Zyrtec) and famotidine (Pepcid) daily to help with your itching. -Benadryl can be added at nighttime -Use over-the-counter topical cream such as hydrocortisone cream to also help with itching. -Use prescription lotion as directed to treat for other possible mite infections. -Wash sheets in hot water. -Follow-up with your primary care provider regarding the knot on your back.  Take care, Dr. Marland Kitchen, NP-c

## 2020-02-07 IMAGING — DX LEFT KNEE 3 VIEWS
3 series · 3 of 3 positions shown · non-contrast
Comparison: None.

CLINICAL DATA: Left anterior and medial knee pain.

EXAM:
LEFT KNEE 3 VIEWS

[dg knee ap/lat w/ sunrise left (1 of 3)]
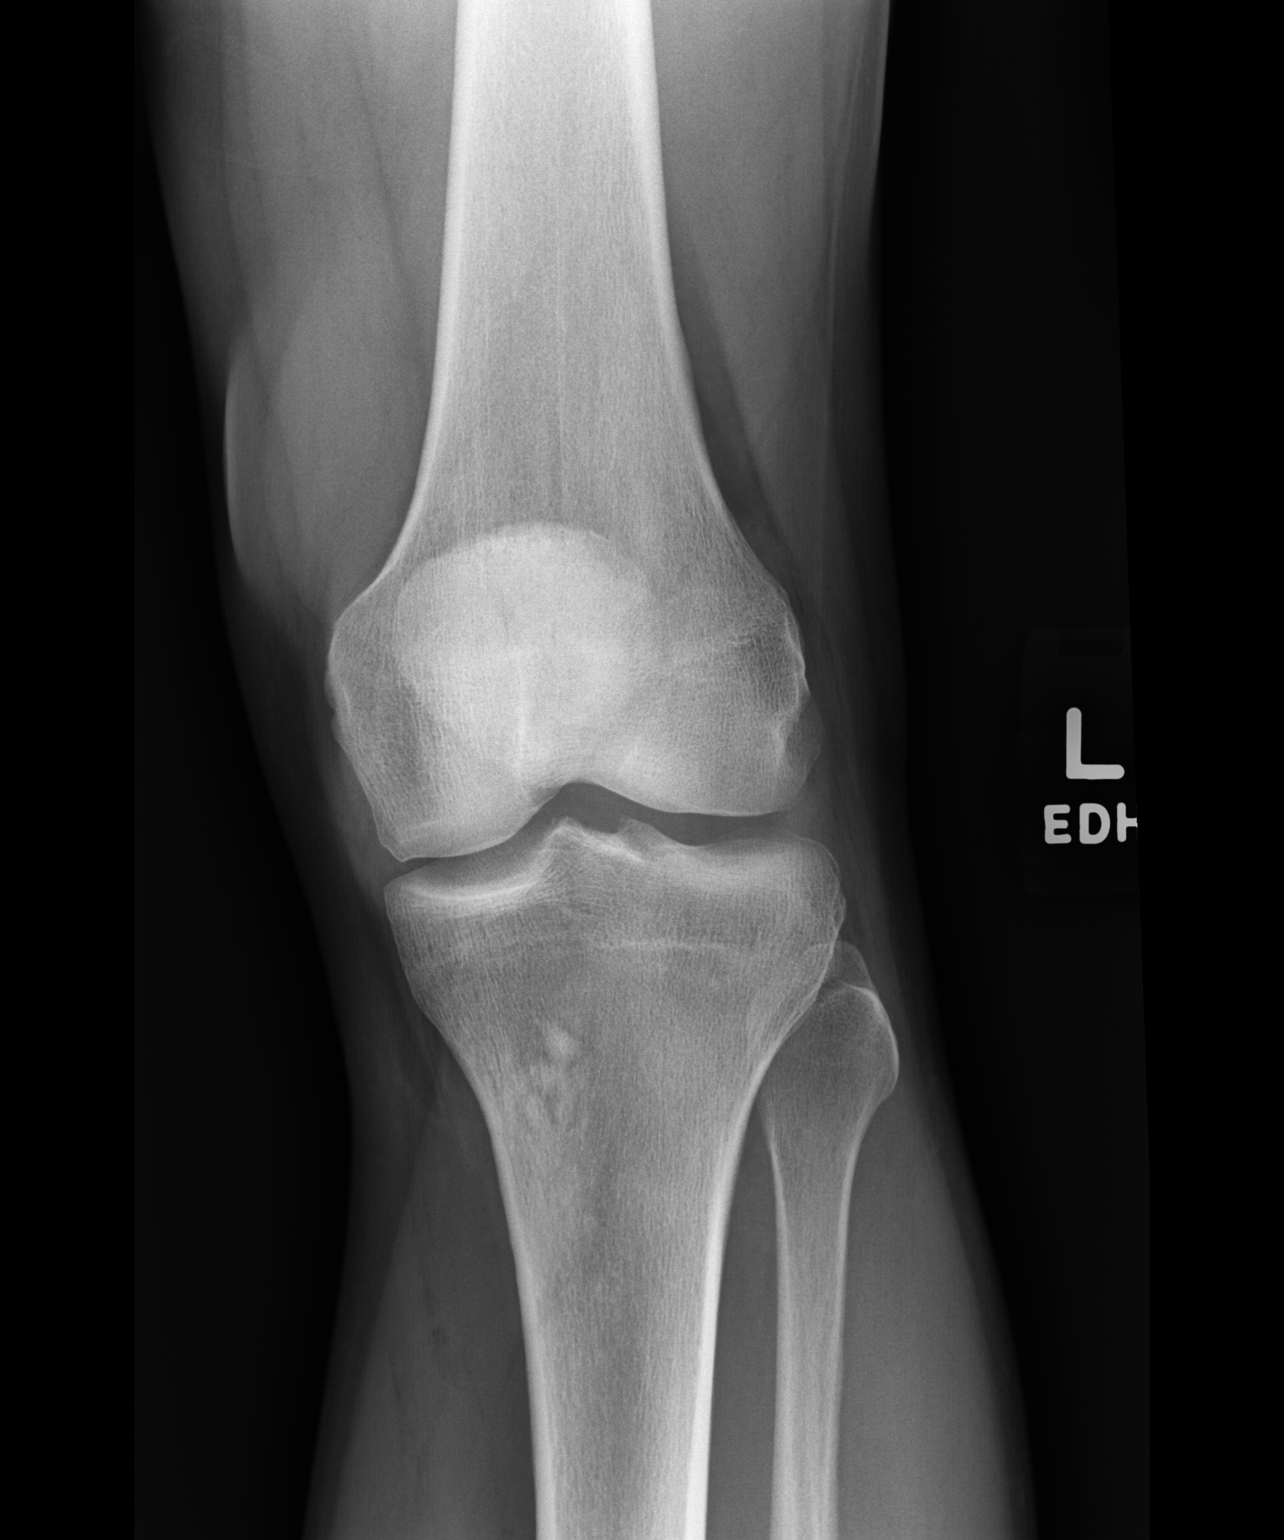

[dg knee ap/lat w/ sunrise left (2 of 3)]
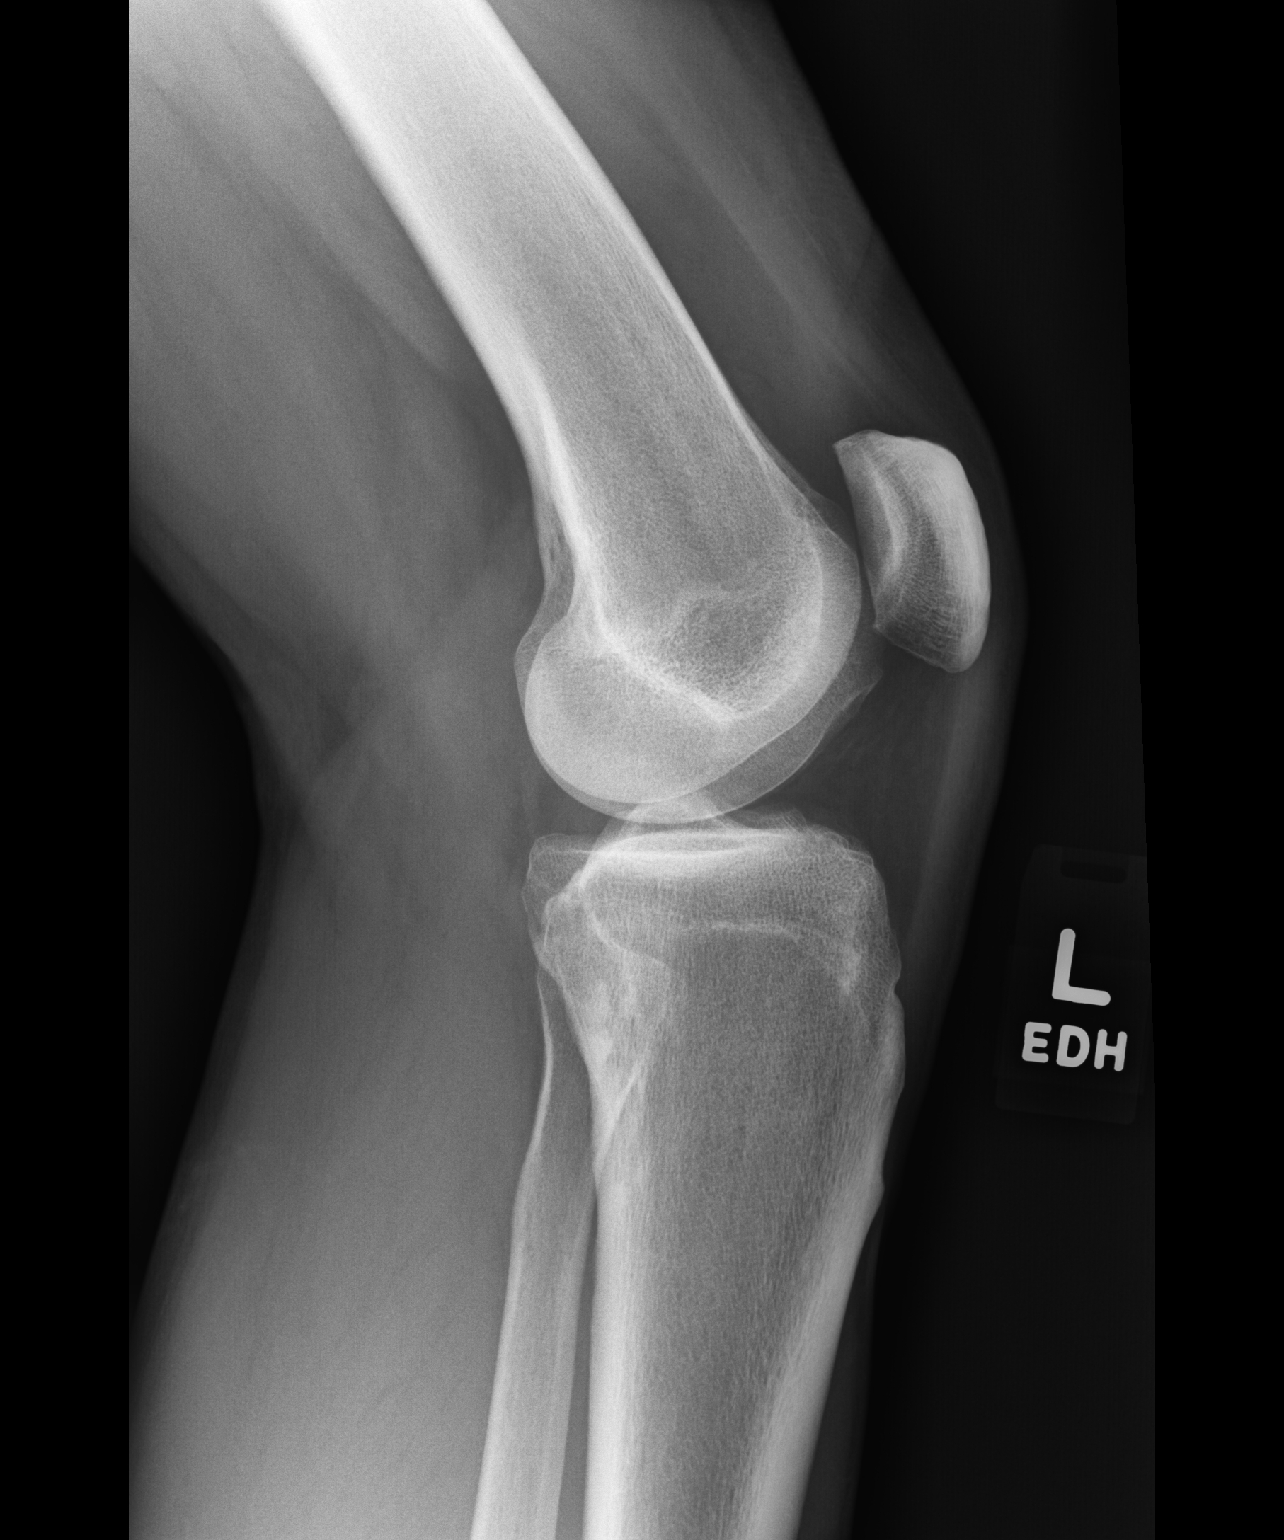

[dg knee ap/lat w/ sunrise left (3 of 3)]
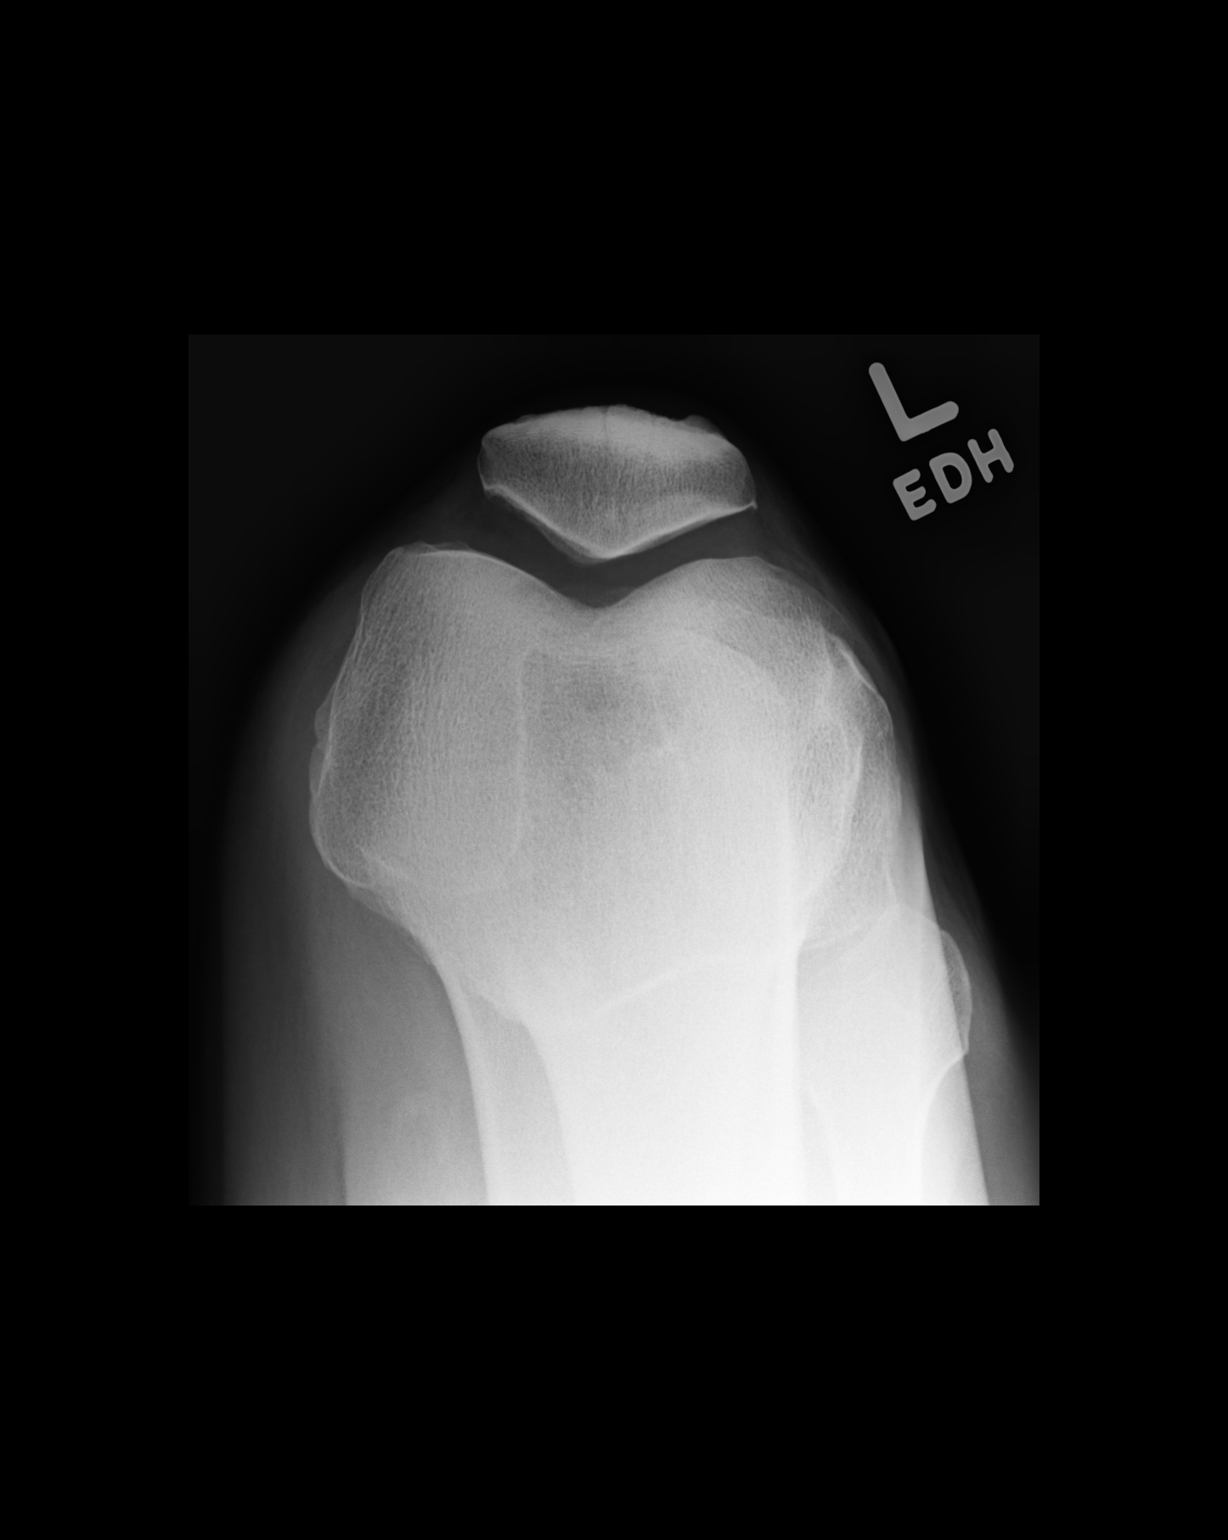

[3 of 3 positions shown; findings below may reference images not displayed]

FINDINGS: No evidence of fracture, dislocation, or joint effusion. No evidence
of arthropathy or other focal bone abnormality. Soft tissues are
unremarkable.
IMPRESSION: No acute osseous injury of the left knee.

## 2020-07-18 ENCOUNTER — Other Ambulatory Visit: Payer: Self-pay

## 2020-07-18 ENCOUNTER — Ambulatory Visit
Admission: EM | Admit: 2020-07-18 | Discharge: 2020-07-18 | Disposition: A | Discharge status: Home / Self Care | Payer: 59 | Attending: Physician Assistant | Admitting: Physician Assistant

## 2020-07-18 DIAGNOSIS — Z9889 Other specified postprocedural states: Secondary | ICD-10-CM

## 2020-07-18 DIAGNOSIS — R0981 Nasal congestion: Secondary | ICD-10-CM

## 2020-07-18 DIAGNOSIS — R059 Cough, unspecified: Secondary | ICD-10-CM

## 2020-07-18 DIAGNOSIS — J019 Acute sinusitis, unspecified: Secondary | ICD-10-CM

## 2020-07-18 MED ORDER — AMOXICILLIN-POT CLAVULANATE 875-125 MG PO TABS: 1.0000 | ORAL_TABLET | Freq: Two times a day (BID) | ORAL | 0 refills | Status: AC

## 2020-07-18 MED ORDER — PREDNISONE 10 MG PO TABS: ORAL_TABLET | ORAL | 0 refills | Status: AC

## 2020-07-18 NOTE — ED Provider Notes (Signed)
[facility]    CSN: [id number] Arrival date & time: 07/18/20  0831      History   Chief Complaint Chief Complaint  Patient presents with  . Nasal Congestion    HPI Edward Spears is a 50 y.o. male presenting for 1 week history of "greenish nasal drainage and congestion."  Also states he has had postnasal drainage and sinus pressure.  Admits to cough that is productive of yellow-green sputum.  Patient states that over the past month he has had intermittent nasal drainage and postnasal drip, but symptoms worsened over the past week and have not improved.  Patient states he believes he is getting worse and not better.  Has been taking OTC Sudafed and Flonase as well as using nasal irrigation system.  Patient reports chronic sinus issues and states that he had a sinus surgery about 8 years ago.  Patient states that he still gets a sinus infection about once a year.  He says he has not had a sinus infection in over a year and a half.  Patient states he normally contact his ENT specialist when he has the symptoms and is generally prescribed antibiotic and a corticosteroid.  Patient states he could not get a hold of his ENT specialist today.  He denies any Covid exposure and is fully vaccinated for COVID-19.  He tested himself at home with a rapid test 2 to 3 days into symptoms and states that her result was negative.  He denies any fever, fatigue, body aches, sore throat, chest discomfort, breathing difficulty, nausea/vomiting or diarrhea.  No other concerns today.  HPI  Past Medical History:  Diagnosis Date  . Allergies   . GERD (gastroesophageal reflux disease)     Patient Active Problem List   Diagnosis Date Noted  . Esophagitis, reflux 06/16/2016  . Pain in joint, ankle and foot 10/16/2012    Past Surgical History:  Procedure Laterality Date  . NASAL SINUS SURGERY         Home Medications    Prior to Admission medications   Medication Sig Start Date End Date  Taking? Authorizing Provider  amoxicillin-clavulanate (AUGMENTIN) 875-125 MG tablet Take 1 tablet by mouth every 12 (twelve) hours for 7 days. 07/18/20 07/25/20 Yes Danton Clap, PA-C  Ascorbic Acid (VITAMIN C PO) Take 1 tablet by mouth daily.   Yes [provider]  cetirizine (ZYRTEC) 10 MG tablet Take 10 mg by mouth daily.   Yes [provider]  fluticasone (FLONASE) 50 MCG/ACT nasal spray SPRAY TWICE IN EACH NOSTRIL ONCE DAILY 08/01/18  Yes [provider]  Multiple Vitamins-Minerals (ZINC PO) Take 1 tablet by mouth daily.   Yes [provider]  predniSONE (DELTASONE) 10 MG tablet Take 6 tabs PO today and decrease by 1 tab daily x 5 days 07/18/20  Yes Laurene Footman B, PA-C  VITAMIN D PO Take 1 tablet by mouth daily.   Yes [provider]  omeprazole (PRILOSEC) 20 MG capsule Take 20 mg by mouth daily.    [provider]  permethrin (ELIMITE) 5 % cream Apply to affected area once.  Keep on for 8-12 hours.  Rinse off afterwards. 02/02/20   Gertie Baron, NP    Family History Family History  Problem Relation Age of Onset  . Cancer Mother        ovarian  . Thyroid disease Mother   . Cancer Father        lymphoma  . Diabetes Father   .  Hypertension Father     Social History Social History   Tobacco Use  . Smoking status: Never Smoker  . Smokeless tobacco: Never Used  Vaping Use  . Vaping Use: Never used  Substance Use Topics  . Alcohol use: Yes    Comment: occassionally  . Drug use: No     Allergies   Kenalog [triamcinolone acetonide]   Review of Systems Review of Systems  Constitutional: Negative for fatigue and fever.  HENT: Positive for congestion, rhinorrhea and sinus pressure. Negative for sinus pain and sore throat.   Respiratory: Positive for cough. Negative for shortness of breath.   Gastrointestinal: Negative for abdominal pain, diarrhea, nausea and vomiting.  Musculoskeletal: Negative for myalgias.   Neurological: Negative for weakness, light-headedness and headaches.  Hematological: Negative for adenopathy.     Physical Exam Triage Vital Signs ED Triage Vitals [07/18/20 0841]  Enc Vitals Group     BP      Pulse      Resp      Temp      Temp src      SpO2      Weight 240 lb (108.9 kg)     Height 6\' 3"  (1.905 m)     Head Circumference      Peak Flow      Pain Score 0     Pain Loc      Pain Edu?      Excl. in [place]?    No data found.  Updated Vital Signs BP (!) 143/93 (BP Location: Left Arm)   Pulse 87   Temp 98.5 F (36.9 C) (Oral)   Resp 18   Ht 6\' 3"  (1.905 m)   Wt 240 lb (108.9 kg)   SpO2 98%   BMI 30.00 kg/m       Physical Exam Vitals and nursing note reviewed.  Constitutional:      General: He is not in acute distress.    Appearance: Normal appearance. He is well-developed and well-nourished. He is not ill-appearing or diaphoretic.  HENT:     Head: Normocephalic and atraumatic.     Right Ear: Tympanic membrane, ear canal and external ear normal.     Left Ear: Tympanic membrane, ear canal and external ear normal.     Nose: Congestion and rhinorrhea present.     Mouth/Throat:     Mouth: Mucous membranes are normal.     Pharynx: Uvula midline. Posterior oropharyngeal erythema (mild with clear PND) present. No oropharyngeal exudate.     Tonsils: No tonsillar abscesses.  Eyes:     General: No scleral icterus.       Right eye: No discharge.        Left eye: No discharge.     Extraocular Movements: EOM normal.     Conjunctiva/sclera: Conjunctivae normal.     Pupils: Pupils are equal, round, and reactive to light.  Neck:     Thyroid: No thyromegaly.     Trachea: No tracheal deviation.  Cardiovascular:     Rate and Rhythm: Normal rate and regular rhythm.     Heart sounds: Normal heart sounds.  Pulmonary:     Effort: Pulmonary effort is normal. No respiratory distress.     Breath sounds: Normal breath sounds. No wheezing or rales.  Musculoskeletal:      Cervical back: Neck supple.  Lymphadenopathy:     Cervical: No cervical adenopathy.  Skin:    General: Skin is warm and dry.  Findings: No rash.  Neurological:     General: No focal deficit present.     Mental Status: He is alert. Mental status is at baseline.     Motor: No weakness.     Gait: Gait normal.  Psychiatric:        Mood and Affect: Mood normal.        Behavior: Behavior normal.        Thought Content: Thought content normal.      UC Treatments / Results  Labs (all labs ordered are listed, but only abnormal results are displayed) Labs Reviewed - No data to display  EKG   Radiology No results found.  Procedures Procedures (including critical care time)  Medications Ordered in UC Medications - No data to display  Initial Impression / Assessment and Plan / UC Course  I have reviewed the triage vital signs and the nursing notes.  Pertinent labs & imaging results that were available during my care of the patient were reviewed by me and considered in my medical decision making (see chart for details).   50 year old male presenting for 1 week history of worsening nasal congestion and drainage.  Patient reports symptoms off and on for the past month.  Negative at home COVID-19 test.  Blood pressure is little elevated at 143/93 in the clinic.  Other vital signs are normal and stable.  He is overall well-appearing but does have nasal congestion and light yellow rhinorrhea as well as posterior pharyngeal erythema and clear PND.  Chest clear to auscultation and heart regular rate and rhythm.  Advised patient this could still be a viral/allergic sinusitis and suggested that he wait about 3 days before starting the antibiotics.  Advised him to continue his over-the-counter products and start the prednisone.  Explained he may not need the antibiotic at all.  Advised that if he starts antibiotic he should complete the full course.  Advised him to follow-up with our  clinic or ENT specialist if he is not feeling better after he finishes this.  Follow-up with [place] as needed.  ED precautions reviewed.  Final Clinical Impressions(s) / UC Diagnoses   Final diagnoses:  Acute sinusitis, recurrence not specified, unspecified location  Nasal congestion  Cough  History of sinus surgery     Discharge Instructions     Continue Flonase and nasal irrigation as well as Sudafed.  Increase rest and fluids.  Start the prednisone if not better in 2 to 3 days you can start the antibiotic.  If you start the antibiotic finish the entire course.  Follow-up with [place] for any worsening symptoms or if not better after he finished antibiotic.  Or, you can follow-up with your ENT.    ED Prescriptions    Medication Sig Dispense Auth. Provider   predniSONE (DELTASONE) 10 MG tablet Take 6 tabs PO today and decrease by 1 tab daily x 5 days 21 tablet Laurene Footman B, PA-C   amoxicillin-clavulanate (AUGMENTIN) 875-125 MG tablet Take 1 tablet by mouth every 12 (twelve) hours for 7 days. 14 tablet Gretta Cool     PDMP not reviewed this encounter.   Danton Clap, PA-C 07/18/20 [phone]

## 2020-07-18 NOTE — Discharge Instructions (Signed)
Continue Flonase and nasal irrigation as well as Sudafed.  Increase rest and fluids.  Start the prednisone if not better in 2 to 3 days you can start the antibiotic.  If you start the antibiotic finish the entire course.  Follow-up with [place] for any worsening symptoms or if not better after he finished antibiotic.  Or, you can follow-up with your ENT.

## 2020-07-18 NOTE — ED Triage Notes (Signed)
Pt c/o recurrent sore throat, sinus congestion, pnd for several days. Pt has been taking decongestant with some improvement. Pt took at-home COVID test and was negative. Pt reports sinus drainage has turned green recently. Pt has been using neti pot with some improvement, but congestion returns. Pt denies f/n/v/d or other symptoms.

## 2020-11-18 ENCOUNTER — Ambulatory Visit: Payer: 59 | Admitting: Podiatry

## 2020-11-18 ENCOUNTER — Ambulatory Visit (INDEPENDENT_AMBULATORY_CARE_PROVIDER_SITE_OTHER): Payer: 59

## 2020-11-18 ENCOUNTER — Other Ambulatory Visit: Payer: Self-pay

## 2020-11-18 DIAGNOSIS — M7751 Other enthesopathy of right foot: Secondary | ICD-10-CM

## 2020-11-18 MED ORDER — BETAMETHASONE SOD PHOS & ACET 6 (3-3) MG/ML IJ SUSP
3.0000 mg | Freq: Once | INTRAMUSCULAR | Status: AC
Start: 2020-11-18 — End: 2020-11-18
  Administered 2020-11-18: 3 mg

## 2020-11-19 NOTE — Progress Notes (Signed)
He presents today with a chief complaint of right ankle pain he states that his ankles been hurting him for about a year now he is it bothers him primarily when he is bending it or flex any in particular walking down steps he says.  She has a really hurts right here she points to the fibular gutter area of the right ankle.  Objective: Vital signs are stable alert oriented x3.  Pulses are palpable.  There is no erythema edema cellulitis drainage or odor he has pain on palpation of the lateral gutter around the peroneals and just below the the anterior talofibular ligament.  There is no crepitation on range of motion of the ankle joint.  Radiographs taken today demonstrate an osseously mature individual no significant findings ankle mortise appears to be normal see no problems with the lateral shoulder or the mortise itself.  Assessment: Capsulitis possible peroneal tendinitis.  Plan: Discussed etiology pathology conservative versus surgical therapies.  At this point I injected Celestone Soluspan with local anesthetic into the lateral gutter of the right ankle joint after sterile skin prep.  Tolerated procedure well without complications.  Would like to follow-up with him in about a month or so.

## 2020-12-23 ENCOUNTER — Ambulatory Visit: Payer: 59 | Admitting: Podiatry
